# Patient Record
Sex: Female | Born: 1985 | Race: White | Hispanic: No | Marital: Single | Smoking: Never smoker
Health system: Southern US, Community
[De-identification: ages and names within clinical notes are randomized; demographics above are authoritative.]

## PROBLEM LIST (undated history)

## (undated) DIAGNOSIS — E041 Nontoxic single thyroid nodule: Secondary | ICD-10-CM

## (undated) DIAGNOSIS — N870 Mild cervical dysplasia: Secondary | ICD-10-CM

## (undated) DIAGNOSIS — E785 Hyperlipidemia, unspecified: Secondary | ICD-10-CM

## (undated) DIAGNOSIS — F329 Major depressive disorder, single episode, unspecified: Secondary | ICD-10-CM

## (undated) DIAGNOSIS — F32A Depression, unspecified: Secondary | ICD-10-CM

## (undated) DIAGNOSIS — F53 Postpartum depression: Secondary | ICD-10-CM

## (undated) DIAGNOSIS — E042 Nontoxic multinodular goiter: Secondary | ICD-10-CM

## (undated) DIAGNOSIS — N938 Other specified abnormal uterine and vaginal bleeding: Secondary | ICD-10-CM

## (undated) DIAGNOSIS — O99345 Other mental disorders complicating the puerperium: Secondary | ICD-10-CM

## (undated) HISTORY — DX: Other specified abnormal uterine and vaginal bleeding: N93.8

## (undated) HISTORY — DX: Major depressive disorder, single episode, unspecified: F32.9

## (undated) HISTORY — DX: Depression, unspecified: F32.A

## (undated) HISTORY — DX: Mild cervical dysplasia: N87.0

## (undated) HISTORY — DX: Nontoxic multinodular goiter: E04.2

## (undated) HISTORY — DX: Nontoxic single thyroid nodule: E04.1

## (undated) HISTORY — DX: Hyperlipidemia, unspecified: E78.5

---
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
## 2006-01-13 ENCOUNTER — Other Ambulatory Visit: Admission: RE | Admit: 2006-01-13 | Discharge: 2006-01-13 | Payer: Self-pay | Admitting: Family Medicine

## 2006-11-01 ENCOUNTER — Other Ambulatory Visit: Admission: RE | Admit: 2006-11-01 | Discharge: 2006-11-01 | Payer: Self-pay | Admitting: Family Medicine

## 2007-06-19 IMAGING — CT CT ABDOMEN W/ CM
1 of 3 series · 14 of 32 positions shown, 19 images · IV contrast (agent unspecified)
Comparison: None.

CLINICAL DATA: Fever and right flank pain.  Question renal abscess.
TECHNIQUE: Contiguous 5 mm axial images of the abdomen and pelvis were obtained after oral and 125 cc of nonionic intravenous contrast. 
 CT ABDOMEN WITH CONTRAST:

[Series 2: abd_pel 5.0 b40f st · axial · 0.63mm/px · z∈[-423,+37]mm · 14 of 104 slices shown, 19 images]
[im 6/104  soft-tissue]
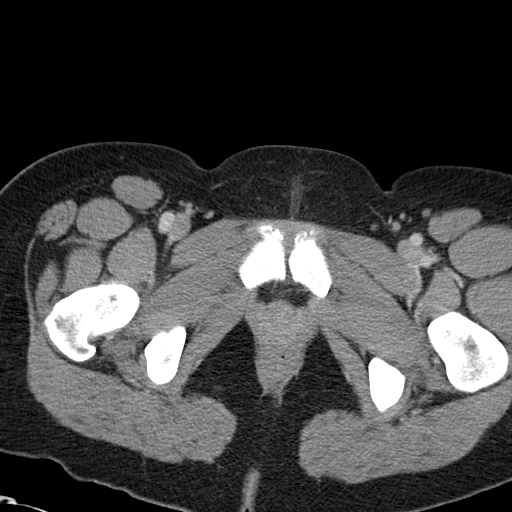
[im 6/104  bone]
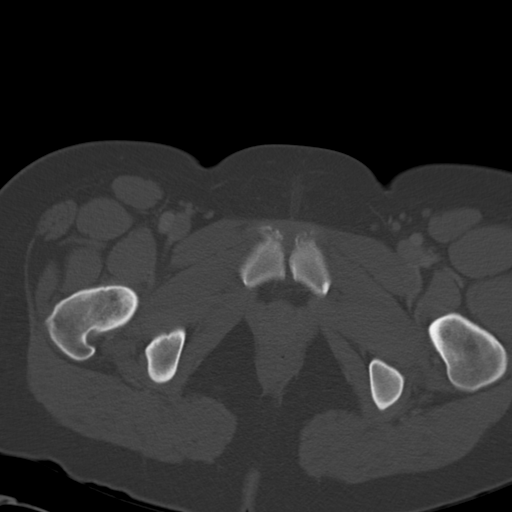
[im 16/104  soft-tissue]
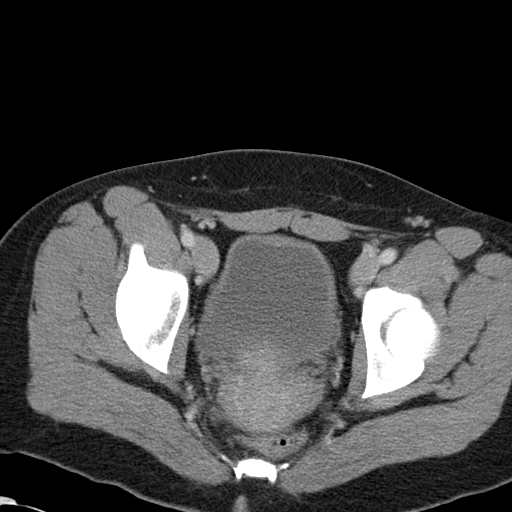
[im 21/104  soft-tissue]
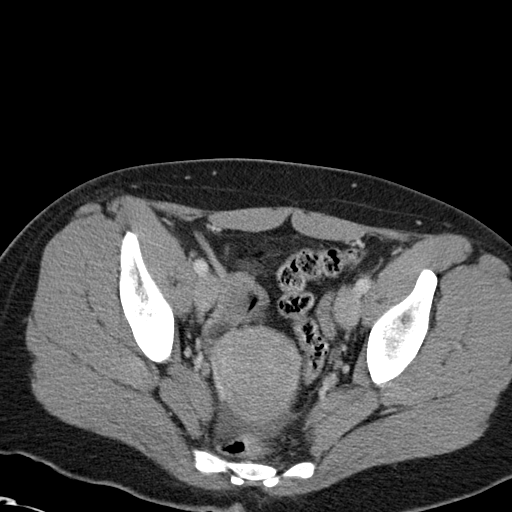
[im 31/104  soft-tissue]
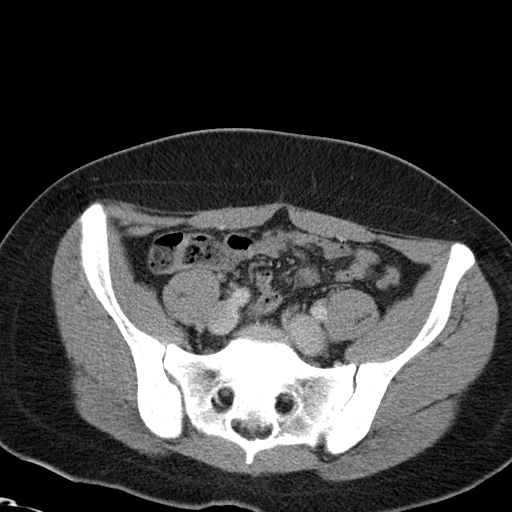
[im 37/104  soft-tissue]
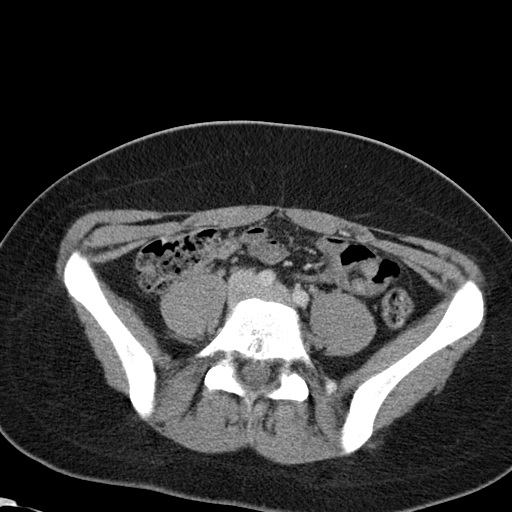
[im 47/104  soft-tissue]
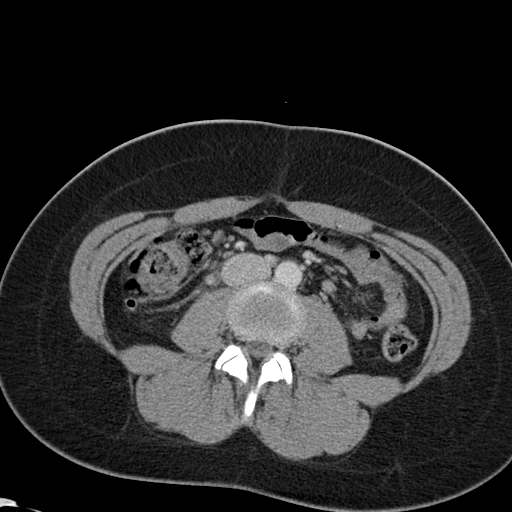
[im 52/104  soft-tissue]
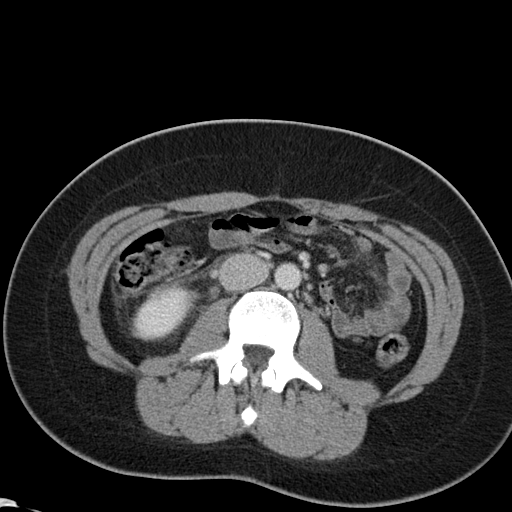
[im 57/104  soft-tissue]
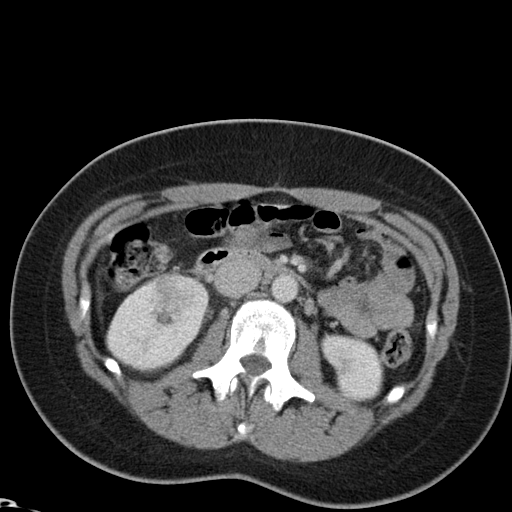
[im 67/104  soft-tissue]
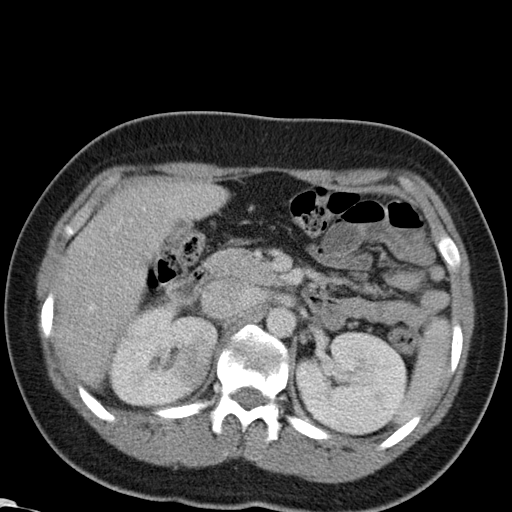
[im 67/104  bone]
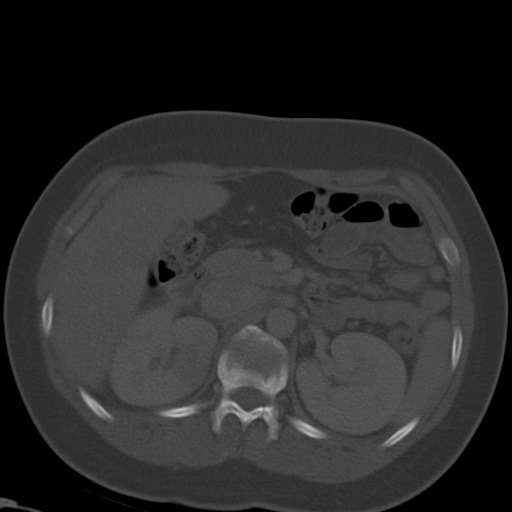
[im 73/104  soft-tissue]
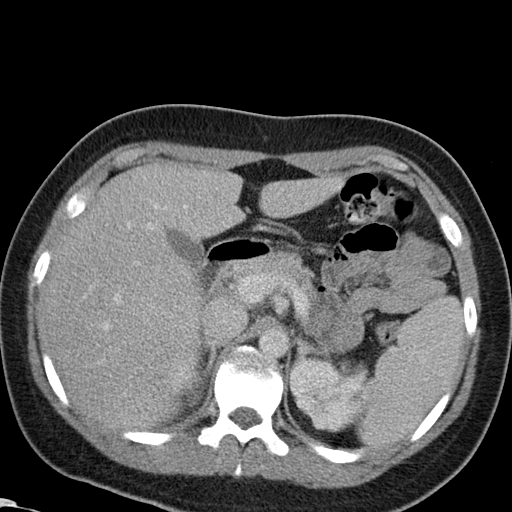
[im 83/104  soft-tissue]
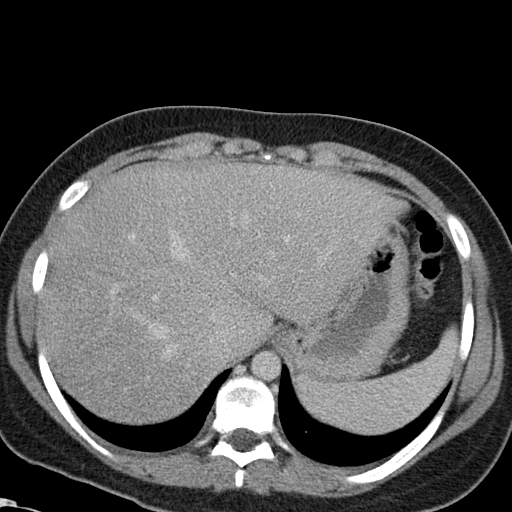
[im 83/104  lung]
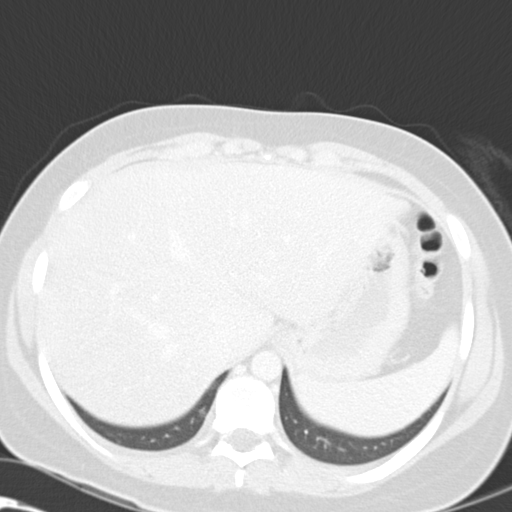
[im 88/104  soft-tissue]
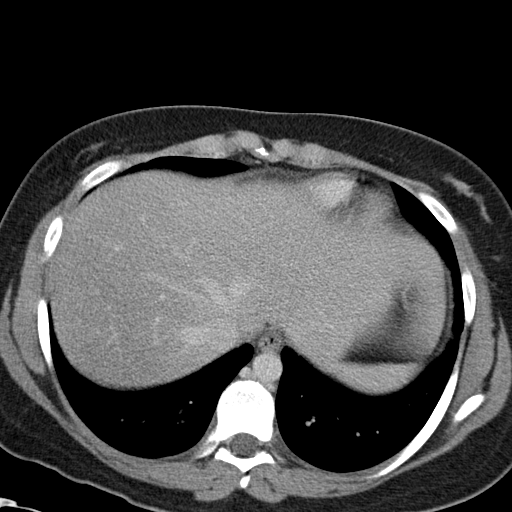
[im 88/104  lung]
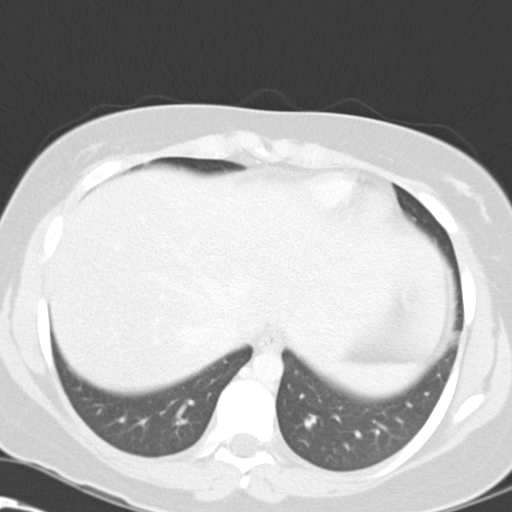
[im 93/104  lung]
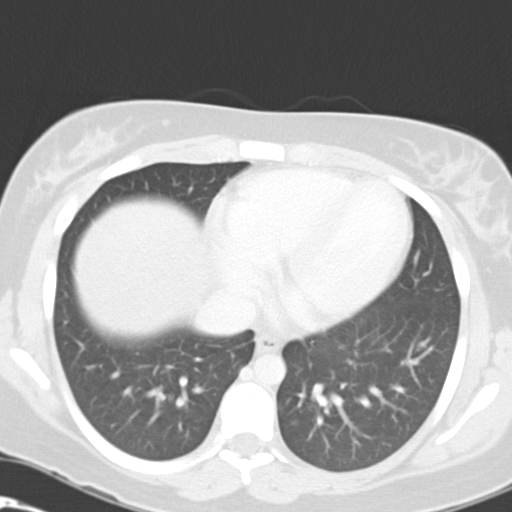
[im 98/104  soft-tissue]
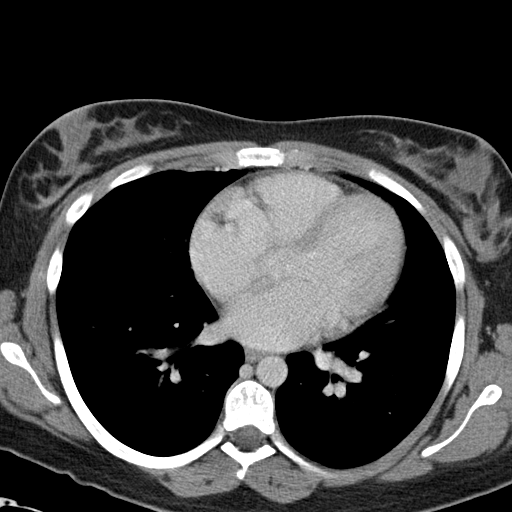
[im 98/104  lung]
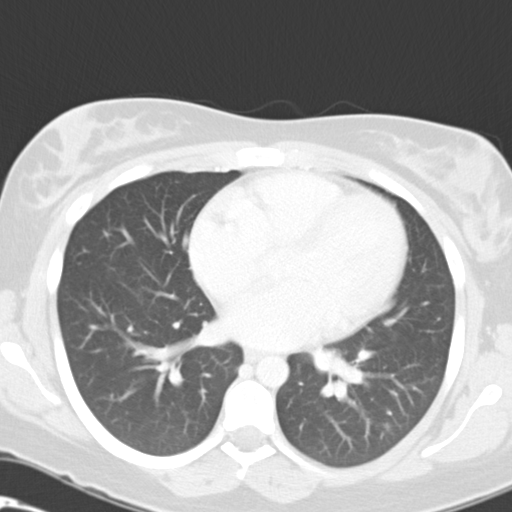

[14 of 32 positions shown; findings below may reference images not displayed]

FINDINGS: Incidental imaging of the lung bases demonstrates ill defined tiny nodular densities in the left lung base, likely related to atelectasis or prior inflammation. 
 The liver is fatty infiltrated without evidence for focal abnormality.  The spleen, stomach, duodenum, pancreas, gallbladder, adrenal glands, and abdominal bowel loops have normal features.  Three is no free fluid, lymphadenopathy, or abdominal aortic aneurysm. 
 The left kidney has a lobular upper pole contour which may be related to scarring, but I cannot completely exclude a mass at this location.  There are wedge shaped areas of edema at three different locations in the right kidney consistent with pyelonephritis.  No evidence for renal abscess at this time.  There is a mild amount of right perinephric edema.
IMPRESSION: Right sided pyelonephritis without evidence for renal abscess at this time.  
 Irregular contour at the upper pole of the left kidney is probably related to scarring although I cannot completely exclude a mass at this location.  Ultrasound may prove helpful or MRI with its multiplanar capability would likely provide additional insight. 
 CT PELVIS WITH CONTRAST:
FINDINGS: A small amount of free fluid is noted in the cul-de-sac.  No evidence for lymphadenopathy.  No adnexal mass.  The appendix and terminal ileum are normal.
IMPRESSION: No acute findings in the anatomic pelvis. 
 [REDACTED]

## 2007-06-23 HISTORY — PX: CHOLECYSTECTOMY: SHX55

## 2007-11-28 ENCOUNTER — Other Ambulatory Visit: Admission: RE | Admit: 2007-11-28 | Discharge: 2007-11-28 | Payer: Self-pay | Admitting: Family Medicine

## 2008-12-31 ENCOUNTER — Other Ambulatory Visit: Admission: RE | Admit: 2008-12-31 | Discharge: 2008-12-31 | Payer: Self-pay | Admitting: Family Medicine

## 2009-07-23 ENCOUNTER — Other Ambulatory Visit: Admission: RE | Admit: 2009-07-23 | Discharge: 2009-07-23 | Payer: Self-pay | Admitting: Family Medicine

## 2010-03-20 ENCOUNTER — Encounter: Admission: RE | Admit: 2010-03-20 | Discharge: 2010-03-20 | Payer: Self-pay | Admitting: Family Medicine

## 2010-11-07 NOTE — H&P (Signed)
NAMEAIMY, SWEETING                ACCOUNT NO.:  [id number]   MEDICAL RECORD NO.:  [id number]          PATIENT TYPE:  INP   LOCATION:  0103                         FACILITY:  [facility]   PHYSICIAN:  Theone Stanley, MD   DATE OF BIRTH:  [birth year]   DATE OF ADMISSION:  02/25/2005  DATE OF DISCHARGE:                                HISTORY & PHYSICAL   CHIEF COMPLAINT:  Fever, chills, myalgia.   HISTORY OF PRESENT ILLNESS:  [name] is a very pleasant 25 year old  female presenting with fever, weakness, malaise, headache, myalgias for the  past four days. It started on Saturday. The patient finally felt bad enough  that she went to the sit-in clinic. Because of her presentation, she was  subsequently told to go to the hospital. She underwent a lumbar puncture on  Tuesday which was negative. However, it was noted that she had a urinary  tract infection. She was given Levaquin in the hospital IV x1. She was  discharged, and because she had a fever of 104, she presented to the sit-in  clinic. They wanted to get a CT scan. The mother decided to bring her to  [facility] in the case that she might be admitted. The patient is still  complaining of mild headache and some fevers.   PAST MEDICAL HISTORY:  None.   MEDICATIONS:  Birth control pills.   ALLERGIES:  PENICILLIN. The patient got a stomachache. This was when she was  a child.   PAST SURGICAL HISTORY:  Tonsillectomy.   FAMILY HISTORY:  Positive for heart, hypertension, diabetes, cancer.   SOCIAL HISTORY:  The patient is from [place] but goes to [place] for  school. No tobacco, alcohol, or illicit drug use.   REVIEW OF SYSTEMS:  Nauseated. The patient had episodes of emesis two days  ago but currently none. She did have frequency and dysuria that improved.  She is also constipated. She has no appetite and has decreased oral intake.   PHYSICAL EXAMINATION:  VITAL SIGNS:  Temperature maximum at 100.7, blood  pressure 120/75,  pulse of 97, respirations 18. Saturating 99%.  HEENT:  Head is normocephalic and atraumatic. Eyes are 3 mm. Pupils react to  light. Extraocular movements intact. Ears normal. Nose and throat clear.  Mucosal moist.  NECK:  Supple. No lymphadenopathy. No JVD.  HEART:  Regular rate and rhythm. No murmurs, gallops, or rubs appreciated.  LUNGS:  Clear to auscultation bilaterally.  ABDOMEN:  Soft, nontender, and nondistended.  EXTREMITIES:  No clubbing, cyanosis, or edema.   LABORATORY DATA:  A CT scan of the abdomen was performed which showed  multiple areas of enhancement in the right kidney; however, no evidence of  abscess. Also noted some density-like areas on the left. Suggest a follow-up  MRI. White count of 15, hemoglobin 11, hematocrit 33, platelets of 153,000.  Sodium 136, potassium 3.3, chloride 108, bicarbonate 22, BUN 4, creatinine  0.8, calcium of 8.5. Urine was negative for nitrite, leukocyte esterase,  WBC's at 3 to 6. Pregnancy was negative.   ASSESSMENT:  [name] presents with fevers, chills,  myalgias, and  evidence of pyelonephritis on CT scan.   PLAN:  1.  Pyelonephritis:  The patient will be admitted for IV antibiotics, IV      hydration. We will need to follow up with the cultures that was obtained      in [facility]. Also blood cultures and urine cultures were obtained      here in the ER. Continue observation.  2.  Hypokalemia:  The patient had episodes of emesis previously. We will      replace this and continue to observe that.      Theone Stanley, MD  Electronically Signed     AEJ/MEDQ  D:  02/25/2005  T:  02/26/2005  Job:  [id number]   cc:   Stacie Acres. Cliffton Asters, M.D.  Fax: [phone]

## 2010-11-07 NOTE — Discharge Summary (Signed)
[name], [name]                ACCOUNT NO.:  [id number]   MEDICAL RECORD NO.:  [id number]          PATIENT TYPE:  OBV   LOCATION:  1412                         FACILITY:  [facility]   PHYSICIAN:  Deirdre Peer. Polite, M.D. DATE OF BIRTH:  02-20-1986   DATE OF ADMISSION:  02/25/2005  DATE OF DISCHARGE:                                 DISCHARGE SUMMARY   DISCHARGE DIAGNOSES:  1.  Pyelonephritis.  2.  Irregular contour at the upper pole of the left kidney probably related      to scar from prior pyelonephritis, ultrasound recommended.  Follow-up      ultrasound has been done with the reports pending.   DISCHARGE MEDICATION:  Cipro 500 mg b.i.d. x eight more days.   DISPOSITION:  Patient is discharged to home.   HISTORY OF PRESENT ILLNESS:  This is an 25 year old female who presented to  [facility] for evaluation of a fever, abdominal pain. Patient was  evaluated in ED.  CT was consistent with pyelonephritis.  Admission was  deemed necessary for further evaluation and treatment.  Please see dictated  H & P for further details.   PAST MEDICAL HISTORY:  None.   ADMISSION MEDICATIONS:  Birth control pills.   ALLERGIES:  Penicillin.   PAST SURGICAL HISTORY:  Tonsillectomy.   FAMILY HISTORY:  Significant for hypertension, diabetes mellitus.   SOCIAL HISTORY:  Negative for tobacco, alcohol or drugs.   REVIEW OF SYSTEMS:  Per admission H & P.   HOSPITAL COURSE:  Patient was admitted to a medical floor for evaluation and  treatment of pyelonephritis.  Patient presented with fever, weakness and  malaise.  Of note, the patient had been evaluated at school for similar  symptoms and diagnosed with urinary tract infection.  There they recommended  hospitalization, however, the patient's family did not want her hospitalized  there.  Also while at school, the patient underwent an LP because of  concerns of possible meningitis.  She also complained of headache when she  had her fever.   At this time, those results are not available, however,  records have been requested by the patient's mother.  We did receive lab  results which showed gram-negative rods in her urine and according to the  patient's father, they showed that the LP results were within normal limits.  During this hospitalization, the patient required judicious IV fluids and IV  antibiotics.  Patient still had nausea, vomiting, abdominal discomfort the  second day of admission.  However, the following day, the patient's symptoms  resolved and she is now clinically stable for discharge.  Please note that  the patient's CT did show pyelo of the right kidney and there was a regular  contour at the upper pole of the left kidney probably related to scarring.  Ultrasound has been recommended.  Ultrasound has been ordered but the  results are pending at the time of this dictation.  There is most likely  secondary scarring to prior pyelonephritis as the patient had described  similar symptoms before of frequency and dysuria, however, not seeking  medical  attention just consuming more fluids.  The patient has been  recommended that when she experiences these symptoms of frequency and  dysuria to seek medical attention for potential antibiotic to prevent  recurrence of pyelonephritis and further scarring of her kidneys.  At this  time, patient is clinically in stable condition.      Deirdre Peer. Polite, M.D.  Electronically Signed     RDP/MEDQ  D:  02/27/2005  T:  02/27/2005  Job:  [id number]

## 2010-11-07 NOTE — Op Note (Signed)
[name], [name]                ACCOUNT NO.:  [id number]   MEDICAL RECORD NO.:  [id number]          PATIENT TYPE:  AMB   LOCATION:  [facility]                         FACILITY:  [facility]   PHYSICIAN:  Mark C. Vernie Ammons, M.D.  DATE OF BIRTH:  August 28, 1985   DATE OF PROCEDURE:  08/27/2005  DATE OF DISCHARGE:                                 OPERATIVE REPORT   PREOPERATIVE DIAGNOSIS:  Bilateral vesicoureteral reflux.   POSTOPERATIVE DIAGNOSIS:  Bilateral vesicoureteral reflux.   PROCEDURE:  Cystoscopy with HIT procedure using Deflux.   SURGEON:  Mark C. Vernie Ammons, M.D.   ANESTHESIA:  General.   BLOOD LOSS:  None.   DRAINS:  None.   SPECIMENS:  None.   COMPLICATIONS:  None.   INDICATIONS:  The patient is an 25 year old white female whose had  difficulty with recurrent UTIs.  She has also had pyelonephritis and was  found on ultrasound to have right renal atrophy.  She was cleared of  infection and was found to have vesicoureteral reflux.  She is sexually  active and he is of childbearing age.  We discussed the treatment options.  She understands and has elected to proceed with endoscopic treatment.   DESCRIPTION OF OPERATION:  After informed consent, the patient went to the  major OR and was placed on the table, administered general anesthesia and  then moved to the dorsal lithotomy position. Her genitalia was sterilely  prepped and draped, and I used to 9-French pediatric cystoscope with the  offset lens and inspected the bladder.  It was noted to be free of any  tumor, stones or inflammatory lesions. Ureteral orifices were noted to be  laterally placed.  The right orifice appeared much more patulous with  hydrodistention compared to the left although both were wide open.   The Deflux needle was then passed through that port of the cystoscope and  hydrodistention of the right orifice was performed.  I advanced the needle  through the mucosa at the 6 o'clock position along the course of  the ureter  up to the mark on the needle with the bevel pointed at the 12 o'clock  position.  I then injected two syringes of Deflux material with excellent  effacement of the ureter.  I then performed an identical procedure on the  left-hand side.  There was good effacement and both sides hydrodistention  did not reveal opening of the ureteral orifice.  The bladder was drained.  The patient was awakened and taken to recovery in stable satisfactory  condition. Of note, I used three syringes of Deflux material on the left-  hand side.   She will be observed in the recovery room and receive 200 mg of Pyridium.  She will then be discharged home with followup in my office in three months  for [facility].      Mark C. Vernie Ammons, M.D.  Electronically Signed     MCO/MEDQ  D:  08/27/2005  T:  08/28/2005  Job:  [id number]

## 2010-11-07 NOTE — H&P (Signed)
[name], [name]                ACCOUNT NO.:  [id number]   MEDICAL RECORD NO.:  [id number]          PATIENT TYPE:  AMB   LOCATION:  [facility]                         [facility]ACILITY:  [facility]   PHYSICIAN:  James A. Ashley Royalty, M.D.DATE OF BIRTH:  10/24/1985   DATE OF ADMISSION:  08/27/2005  DATE OF DISCHARGE:                                HISTORY & PHYSICAL   HISTORY OF PRESENT ILLNESS:  This is a 25 year old gravida 2, para 2, who  underwent vaginal hysterectomy August 18, 2005. The patient's  postoperative course was complicated by an anemia which was felt to be  probably secondary to a pelvic hematoma. She was given 2 units of packed red  blood cells and responded beautifully to that. The hemoglobin appeared to be  quite stable and she remained afebrile throughout. She was discharged on the  second postpartum day with close follow up. She returned August 25, 2005 for  postoperative evaluation. She was afebrile and without complaints. CBC was  obtained that day and revealed a white blood count of 9700, hemoglobin 12.1.  The patient returned today stating that she has developed a low-grade fever  intermittently. The maximum she has recorded is 101.5 degrees Fahrenheit.  She also complains of some vague lower abdominal cramping. She also has some  cramping when she urinates though she denies dysuria per se. She also denies  urinary frequency or urgency, sputum production and calf tenderness.   MEDICATIONS:  Motrin 600 mg q.4 hours p.r.n., Zoloft 100 mg daily.   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   PAST SURGICAL HISTORY:  Cold knife conization April 29, 2005. Vaginal  hysterectomy as above.   ALLERGIES:  ASPIRIN.   FAMILY HISTORY:  Noncontributory.   SOCIAL HISTORY:  The patient denies use of  tobacco or significant alcohol.   REVIEW OF SYSTEMS:  Noncontributory.   PHYSICAL EXAMINATION:  GENERAL:  Well-developed, well-nourished, pleasant  white female in no acute distress.  VITAL SIGNS:  Temperature 100.3 degrees fetal heart Fahrenheit. Vital signs  stable.  SKIN:  Warm and dry without lesions.  CHEST:  Lungs are clear.  CARDIAC:  Regular rate and rhythm.  ABDOMEN:  Soft and nontender without masses or organomegaly. Bowel sounds  are active.  MUSCULOSKELETAL:  Examination reveals slight left CVA tenderness.  PELVIC EXAMINATION:  External genitalia slightly blood stained. There are no  lesions. The vagina is similarly without gross lesions. There is a small  amount of blood in the vagina. The vaginal cuff appears to be healing well.  It also appears to be intact. Bimanual examination is performed gingerly and  reveals no obvious mass or undue tenderness.   IMPRESSION:  1.  Status post total vaginal hysterectomy August 18, 2005.  2.  Probable pelvic hematoma.  3.  Anemia - probably secondary to #2, improved.  4.  Low-grade fever. Differential includes infected hematoma, urinary tract  infection. Doubt thromboembolic phenomenon, pulmonary origin, etc.   PLAN:  Will place patient on outpatient observation. Will obtain CT scan of  the abdomen and pelvis as well as blood cultures, catheterized urinalysis  and culture as well as CBC. The patient will be given analgesics without  antipyretic properties and observe closely.      James A. Ashley Royalty, M.D.  Electronically Signed     JAM/MEDQ  D:  08/27/2005  T:  08/27/2005  Job:  [id number]

## 2013-08-20 HISTORY — PX: TUBAL LIGATION: SHX77

## 2014-07-27 ENCOUNTER — Emergency Department (HOSPITAL_COMMUNITY): Admission: EM | Admit: 2014-07-27 | Discharge: 2014-07-27 | Discharge status: Absent without leave | Payer: Self-pay

## 2014-07-27 ENCOUNTER — Encounter (HOSPITAL_COMMUNITY): Payer: Self-pay | Admitting: *Deleted

## 2014-07-27 ENCOUNTER — Emergency Department (HOSPITAL_COMMUNITY)
Admission: EM | Admit: 2014-07-27 | Discharge: 2014-07-27 | Disposition: A | Discharge status: Home / Self Care | Attending: Emergency Medicine | Admitting: Emergency Medicine

## 2014-07-27 DIAGNOSIS — F418 Other specified anxiety disorders: Secondary | ICD-10-CM | POA: Diagnosis not present

## 2014-07-27 DIAGNOSIS — F419 Anxiety disorder, unspecified: Secondary | ICD-10-CM

## 2014-07-27 DIAGNOSIS — F329 Major depressive disorder, single episode, unspecified: Secondary | ICD-10-CM

## 2014-07-27 DIAGNOSIS — Z91040 Latex allergy status: Secondary | ICD-10-CM | POA: Diagnosis not present

## 2014-07-27 DIAGNOSIS — Z880 Allergy status to penicillin: Secondary | ICD-10-CM | POA: Insufficient documentation

## 2014-07-27 HISTORY — DX: Postpartum depression: F53.0

## 2014-07-27 HISTORY — DX: Other mental disorders complicating the puerperium: O99.345

## 2014-07-27 MED ORDER — SERTRALINE HCL 50 MG PO TABS: 50.0000 mg | ORAL_TABLET | Freq: Every day | ORAL | Status: DC

## 2014-07-27 NOTE — Discharge Instructions (Signed)
Read the information below.  Use the prescribed medication as directed.  Please discuss all new medications with your pharmacist.  You may return to the Emergency Department at any time for worsening condition or any new symptoms that concern you.  If there is any possibility that you might be pregnant, please let your health care provider know and discuss this with the pharmacist to ensure medication safety.  If you begin to feel more depressed, feel that you are unsafe or may cause harm to yourself or others, please call 911 or go to your nearest emergency department.    Depression Depression refers to feeling sad, low, down in the dumps, blue, gloomy, or empty. In general, there are two kinds of depression:  Normal sadness or normal grief. This kind of depression is one that we all feel from time to time after upsetting life experiences, such as the loss of a job or the ending of a relationship. This kind of depression is considered normal, is short lived, and resolves within a few days to 2 weeks. Depression experienced after the loss of a loved one (bereavement) often lasts longer than 2 weeks but normally gets better with time.  Clinical depression. This kind of depression lasts longer than normal sadness or normal grief or interferes with your ability to function at home, at work, and in school. It also interferes with your personal relationships. It affects almost every aspect of your life. Clinical depression is an illness. Symptoms of depression can also be caused by conditions other than those mentioned above, such as:  Physical illness. Some physical illnesses, including underactive thyroid gland (hypothyroidism), severe anemia, specific types of cancer, diabetes, uncontrolled seizures, heart and lung problems, strokes, and chronic pain are commonly associated with symptoms of depression.  Side effects of some prescription medicine. In some people, certain types of medicine can cause symptoms  of depression.  Substance abuse. Abuse of alcohol and illicit drugs can cause symptoms of depression. SYMPTOMS Symptoms of normal sadness and normal grief include the following:  Feeling sad or crying for short periods of time.  Not caring about anything (apathy).  Difficulty sleeping or sleeping too much.  No longer able to enjoy the things you used to enjoy.  Desire to be by oneself all the time (social isolation).  Lack of energy or motivation.  Difficulty concentrating or remembering.  Change in appetite or weight.  Restlessness or agitation. Symptoms of clinical depression include the same symptoms of normal sadness or normal grief and also the following symptoms:  Feeling sad or crying all the time.  Feelings of guilt or worthlessness.  Feelings of hopelessness or helplessness.  Thoughts of suicide or the desire to harm yourself (suicidal ideation).  Loss of touch with reality (psychotic symptoms). Seeing or hearing things that are not real (hallucinations) or having false beliefs about your life or the people around you (delusions and paranoia). DIAGNOSIS  The diagnosis of clinical depression is usually based on how bad the symptoms are and how long they have lasted. Your health care provider will also ask you questions about your medical history and substance use to find out if physical illness, use of prescription medicine, or substance abuse is causing your depression. Your health care provider may also order blood tests. TREATMENT  Often, normal sadness and normal grief do not require treatment. However, sometimes antidepressant medicine is given for bereavement to ease the depressive symptoms until they resolve. The treatment for clinical depression depends on how bad  the symptoms are but often includes antidepressant medicine, counseling with a mental health professional, or both. Your health care provider will help to determine what treatment is best for  you. Depression caused by physical illness usually goes away with appropriate medical treatment of the illness. If prescription medicine is causing depression, talk with your health care provider about stopping the medicine, decreasing the dose, or changing to another medicine. Depression caused by the abuse of alcohol or illicit drugs goes away when you stop using these substances. Some adults need professional help in order to stop drinking or using drugs. SEEK IMMEDIATE MEDICAL CARE IF:  You have thoughts about hurting yourself or others.  You lose touch with reality (have psychotic symptoms).  You are taking medicine for depression and have a serious side effect. FOR MORE INFORMATION  National Alliance on Mental Illness: www.nami.[employer]  National Institute of Mental Health: http://www.maynard.net/www.nimh.nih.gov Document Released: 06/05/2000 Document Revised: 10/23/2013 Document Reviewed: 09/07/2011 [facility] Patient Information 2015 [place], [place]. This information is not intended to replace advice given to you by your health care provider. Make sure you discuss any questions you have with your health care provider.  Panic Attacks Panic attacks are sudden, short-livedsurges of severe anxiety, fear, or discomfort. They may occur for no reason when you are relaxed, when you are anxious, or when you are sleeping. Panic attacks may occur for a number of reasons:   Healthy people occasionally have panic attacks in extreme, life-threatening situations, such as war or natural disasters. Normal anxiety is a protective mechanism of the body that helps us react to danger (fight or flight response).  Panic attacks are often seen with anxiety disorders, such as panic disorder, social anxiety disorder, generalized anxiety disorder, and phobias. Anxiety disorders cause excessive or uncontrollable anxiety. They may interfere with your relationships or other life activities.  Panic attacks are sometimes seen with other  mental illnesses, such as depression and posttraumatic stress disorder.  Certain medical conditions, prescription medicines, and drugs of abuse can cause panic attacks. SYMPTOMS  Panic attacks start suddenly, peak within 20 minutes, and are accompanied by four or more of the following symptoms:  Pounding heart or fast heart rate (palpitations).  Sweating.  Trembling or shaking.  Shortness of breath or feeling smothered.  Feeling choked.  Chest pain or discomfort.  Nausea or strange feeling in your stomach.  Dizziness, light-headedness, or feeling like you will faint.  Chills or hot flushes.  Numbness or tingling in your lips or hands and feet.  Feeling that things are not real or feeling that you are not yourself.  Fear of losing control or going crazy.  Fear of dying. Some of these symptoms can mimic serious medical conditions. For example, you may think you are having a heart attack. Although panic attacks can be very scary, they are not life threatening. DIAGNOSIS  Panic attacks are diagnosed through an assessment by your health care provider. Your health care provider will ask questions about your symptoms, such as where and when they occurred. Your health care provider will also ask about your medical history and use of alcohol and drugs, including prescription medicines. Your health care provider may order blood tests or other studies to rule out a serious medical condition. Your health care provider may refer you to a mental health professional for further evaluation. TREATMENT   Most healthy people who have one or two panic attacks in an extreme, life-threatening situation will not require treatment.  The treatment for panic attacks  associated with anxiety disorders or other mental illness typically involves counseling with a mental health professional, medicine, or a combination of both. Your health care provider will help determine what treatment is best for  you.  Panic attacks due to physical illness usually go away with treatment of the illness. If prescription medicine is causing panic attacks, talk with your health care provider about stopping the medicine, decreasing the dose, or substituting another medicine.  Panic attacks due to alcohol or drug abuse go away with abstinence. Some adults need professional help in order to stop drinking or using drugs. HOME CARE INSTRUCTIONS   Take all medicines as directed by your health care provider.   Schedule and attend follow-up visits as directed by your health care provider. It is important to keep all your appointments. SEEK MEDICAL CARE IF:  You are not able to take your medicines as prescribed.  Your symptoms do not improve or get worse. SEEK IMMEDIATE MEDICAL CARE IF:   You experience panic attack symptoms that are different than your usual symptoms.  You have serious thoughts about hurting yourself or others.  You are taking medicine for panic attacks and have a serious side effect. MAKE SURE YOU:  Understand these instructions.  Will watch your condition.  Will get help right away if you are not doing well or get worse. Document Released: 06/08/2005 Document Revised: 06/13/2013 Document Reviewed: 01/20/2013 [facility] Patient Information 2015 [place], [place]. This information is not intended to replace advice given to you by your health care provider. Make sure you discuss any questions you have with your health care provider.

## 2014-07-27 NOTE — ED Provider Notes (Signed)
CSN: [id number]     Arrival date & time 07/27/14  0840 History   First MD Initiated Contact with Patient 07/27/14 [id number]     Chief Complaint  Patient presents with  . Anxiety     (Consider location/radiation/quality/duration/timing/severity/associated sxs/prior Treatment) The history is provided by the patient.     Patient with hx depression presents with symptoms similar to what she has experienced in the past.  States she has had increased stress with work, her husband coming home from [employer], her mother-in-law moving in and she is feeling anxious, having difficulty getting to sleep and staying asleep, experiencing negative "self talk" and feels she is overreacting to even minor details.  States this feels like prior depression that has always been helped by taking Zoloft, 100mg  (with 50mg  daily x 1 week build up).  Denies any physical symptoms with exception of exhaustion from not sleeping.  Denies SI, HI.  Denies hearing voices that are not her own.  She has attempted to see her insurance-approved psychiatry group but they are closed on Fridays.  She has a new PCP Dr Adline PealsVivienne Sun whom she has not yet seen.    Past Medical History  Diagnosis Date  . Mild postpartum depression about 1 year ago    pt treated with Zoloft x2 months   Past Surgical History  Procedure Laterality Date  . Tubal ligation  march 2015  . Tonsillectomy  1993  . Cholecystectomy  2009   History reviewed. No pertinent family history. History  Substance Use Topics  . Smoking status: Never Smoker   . Smokeless tobacco: Not on file  . Alcohol Use: No   OB History    Obstetric Comments   2 children     Review of Systems  All other systems reviewed and are negative.     Allergies  Latex; Morphine and related; and Penicillins  Home Medications   Prior to Admission medications   Not on File   BP 125/71 mmHg  Pulse 69  Temp(Src) 98.1 F (36.7 C) (Oral)  Resp 18  Ht 5\' 7"  (1.702 m)  Wt 237 lb  (107.502 kg)  BMI 37.11 kg/m2  SpO2 98%  LMP 06/05/2014 (Approximate) Physical Exam  Constitutional: She appears well-developed and well-nourished. No distress.  HENT:  Head: Normocephalic and atraumatic.  Neck: Neck supple.  Cardiovascular: Normal rate and regular rhythm.   Pulmonary/Chest: Effort normal and breath sounds normal. No respiratory distress. She has no wheezes. She has no rales.  Abdominal: Soft. She exhibits no distension. There is no tenderness. There is no rebound and no guarding.  Neurological: She is alert.  Skin: She is not diaphoretic.  Psychiatric: Her speech is normal and behavior is normal. Thought content normal. Her mood appears anxious. Cognition and memory are normal.  Nursing note and vitals reviewed.   ED Course  Procedures (including critical care time) Labs Review Labs Reviewed - No data to display  Imaging Review No results found.   EKG Interpretation None      MDM   Final diagnoses:  Anxiety and depression    Afebrile, nontoxic patient with hx depression and anxiety experiencing symptoms consistent with same.  No SI, no red flags. Pt appears to be a reasonable, rational person with good follow up.  Has taken Zoloft several times in life previously and feels this will work for her again.  She declines any additional benzo.  She knows to seek help for worsening symptoms and specifically for any SI.  She will call and follow up with PCP and with psychiatry.    D/C home with two week supply of Zoloft.  Discussed result, findings, treatment, and follow up  with patient.  Pt given return precautions.  Pt verbalizes understanding and agrees with plan.          Trixie Dredge, PA-C 07/27/14 1618  Purvis Sheffield, MD 07/28/14 [phone]

## 2014-07-27 NOTE — ED Notes (Signed)
Pt reports she has been experiencing anxiety most recently after starting her new job on June 22, 2014.  Pt feels that she is overwhelmed with work as well as family demands.Pt has tried to contact psychiatrist within network but was not successful as they are closed on Fridays. Pt is scheduled to return to work this coming Monday and felt she needed to come in. Pt reports being on Zoloft in the past x 2 months after sons birth. She felt she was doing better so discontinued use.

## 2014-10-09 ENCOUNTER — Other Ambulatory Visit: Payer: Self-pay | Admitting: Orthopaedic Surgery

## 2014-10-09 DIAGNOSIS — M542 Cervicalgia: Secondary | ICD-10-CM

## 2014-10-10 ENCOUNTER — Ambulatory Visit
Admission: RE | Admit: 2014-10-10 | Discharge: 2014-10-10 | Disposition: A | Discharge status: Home / Self Care | Payer: BC Managed Care – PPO | Source: Ambulatory Visit | Attending: Orthopaedic Surgery | Admitting: Orthopaedic Surgery

## 2014-10-10 DIAGNOSIS — M542 Cervicalgia: Secondary | ICD-10-CM

## 2014-12-02 ENCOUNTER — Encounter (HOSPITAL_COMMUNITY): Payer: Self-pay

## 2014-12-02 ENCOUNTER — Emergency Department (HOSPITAL_COMMUNITY)
Admission: EM | Admit: 2014-12-02 | Discharge: 2014-12-02 | Disposition: A | Discharge status: Home / Self Care | Payer: BC Managed Care – PPO | Attending: Emergency Medicine | Admitting: Emergency Medicine

## 2014-12-02 DIAGNOSIS — S99921A Unspecified injury of right foot, initial encounter: Secondary | ICD-10-CM | POA: Diagnosis present

## 2014-12-02 DIAGNOSIS — Y92197 Garden or yard of other specified residential institution as the place of occurrence of the external cause: Secondary | ICD-10-CM | POA: Insufficient documentation

## 2014-12-02 DIAGNOSIS — Y939 Activity, unspecified: Secondary | ICD-10-CM | POA: Insufficient documentation

## 2014-12-02 DIAGNOSIS — F329 Major depressive disorder, single episode, unspecified: Secondary | ICD-10-CM | POA: Insufficient documentation

## 2014-12-02 DIAGNOSIS — Z79899 Other long term (current) drug therapy: Secondary | ICD-10-CM | POA: Insufficient documentation

## 2014-12-02 DIAGNOSIS — Y999 Unspecified external cause status: Secondary | ICD-10-CM | POA: Diagnosis not present

## 2014-12-02 DIAGNOSIS — Z91040 Latex allergy status: Secondary | ICD-10-CM | POA: Insufficient documentation

## 2014-12-02 DIAGNOSIS — S92211A Displaced fracture of cuboid bone of right foot, initial encounter for closed fracture: Secondary | ICD-10-CM | POA: Diagnosis not present

## 2014-12-02 DIAGNOSIS — X58XXXA Exposure to other specified factors, initial encounter: Secondary | ICD-10-CM | POA: Diagnosis not present

## 2014-12-02 DIAGNOSIS — Z880 Allergy status to penicillin: Secondary | ICD-10-CM | POA: Diagnosis not present

## 2014-12-02 MED ORDER — HYDROCODONE-ACETAMINOPHEN 5-325 MG PO TABS: 1.0000 | ORAL_TABLET | ORAL | Status: DC | PRN

## 2014-12-02 MED ORDER — ONDANSETRON HCL 4 MG PO TABS
4.0000 mg | ORAL_TABLET | Freq: Once | ORAL | Status: AC
  Administered 2014-12-02: 4 mg via ORAL
  Filled 2014-12-02: qty 1

## 2014-12-02 MED ORDER — HYDROCODONE-ACETAMINOPHEN 5-325 MG PO TABS
1.0000 | ORAL_TABLET | Freq: Once | ORAL | Status: AC
  Administered 2014-12-02: 1 via ORAL
  Filled 2014-12-02: qty 1

## 2014-12-02 NOTE — Discharge Instructions (Signed)
Tarsal Fracture  with Rehab The tarsal bones are a collection of 7 bones that collectively make up the back half of the foot (hind foot). A tarsal fracture is a break in one of these bones. The 7 tarsal bones are the ankle bone (talus), heel bone (calcaneus), the 4 cuneiform bones, the cuboid, and the navicular. SYMPTOMS   Severe pain at the time of injury, which may persist for several weeks.  Tenderness, inflammation, or bruising (contusion) at the fracture site.  Swelling within the foot, causing numbness and paralysis, which are signs of nerve and blood vessel damage (uncommon). CAUSES  Tarsal fractures occur when a force is placed on the bone that is greater than the bone can withstand. Common causes of injury include:  Direct trauma (injury from impact) to the foot.  Awkwardly landing on the foot after jumping.  Twisting injury to the ankle and foot. RISK INCREASES WITH:  Contact sports (football, rugby, or lacrosse).  Jumping sports (basketball or volleyball).  Previous ankle or foot injury.  Poor strength and flexibility. PREVENTION  Warm up and stretch properly before activity.  Maintain physical fitness:  Strength, flexibility, and endurance.  Cardiovascular fitness (increases heart rate).  Protect the ankle and foot with taping, braces, or compression bandages.  Wear properly fitted shoes that are appropriate for the activity. PROGNOSIS  If treated properly, tarsal fractures can be expected to heal with non-surgical treatment. Occasionally, surgery is necessary to treat bone fragments that are out of alignment (displaced fracture). RELATED COMPLICATIONS   Failure of the fracture to heal (nonunion).  Healing of the fracture in a poor position (malunion).  Recurring symptoms that result in a chronic problem.  Prolonged healing time, if improperly treated or re-injured.  Bone death, due to poor circulation to the fracture site (rare).  Arthritis of the  foot. TREATMENT  Treatment first involves the use of ice and medicine, to reduce pain and inflammation. If the bone fragments are out of alignment (displaced fracture), then the bone must be immediately realigned (reduction) by a trained professional. Fractures that cannot be realigned by hand, or fractures where the bones poke out through the skin (open fracture), may require surgery to hold the fracture in place with screws, pins, and plates. Once in proper alignment, the foot and ankle must be kept still for a period of time to allow for healing. After this period, it is important to perform strengthening and stretching exercises to help regain strength and a full range of motion. These exercises may be completed at home or with a therapist.  MEDICATION   If pain medicine is necessary, nonsteroidal anti-inflammatory medications (aspirin and ibuprofen) or other minor pain relievers (acetaminophen) are often recommended.  Do not take pain medication for 7 days before surgery.  Prescription pain relievers may be given if your caregiver thinks they are necessary. Use only as directed and only as much as you need. COLD THERAPY  Cold treatment (icing) relieves pain and reduces inflammation. Cold treatment should be applied for 10 to 15 minutes every 2 to 3 hours, and immediately after any activity that aggravates your symptoms. Use ice packs or an ice massage. SEEK MEDICAL CARE IF:   Treatment does not seem to help, or the condition worsens.  Any medicines produce negative side effects.  Any complications from surgery occur:  Pain, numbness, or coldness in the affected foot.  Discoloration beneath the toenails (blue or gray) of the affected foot.  Signs of infections (fever, pain, inflammation, redness,  or persistent bleeding). EXERCISES RANGE OF MOTION (ROM) AND STRETCHING EXERCISES - Tarsal Fracture These exercises may help you when beginning to restore activity to your injured foot. Your  symptoms may go away with or without further involvement from your physician, physical therapist or athletic trainer. While completing these exercises, remember:   Restoring tissue flexibility helps normal motion to return to the joints. This allows healthier, less painful movement and activity.  An effective stretch should be held for at least 30 seconds.  A stretch should never be painful. You should only feel a gentle lengthening or release in the stretched tissue. RANGE OF MOTION - Dorsi/Plantar Flexion  While sitting with your right / left knee straight, draw the top of your foot upwards, by flexing your ankle. Then reverse the motion, pointing your toes downward.  Hold each position for __________ seconds.  After completing your first set of exercises, repeat this exercise with your knee bent. Repeat __________ times. Complete this exercise __________ times per day.  RANGE OF MOTION- Ankle Plantar Flexion  Sit with your right / left leg crossed over your opposite knee.  Use your opposite hand to pull the top of your foot and toes toward you.  You should feel a gentle stretch on the top of your foot and ankle. Hold this position for __________ seconds. Repeat __________ times. Complete __________ times per day.  RANGE OF MOTION - Ankle Eversion  Sit with your right / left ankle crossed over your opposite knee.  Grip your foot with your opposite hand, placing your thumb on the top of your foot and your fingers across the bottom of your foot.  Gently push your foot downward with a slight rotation so your smallest toes rise slightly toward the ceiling.  You should feel a gentle stretch on the inside of your ankle. Hold the stretch for __________ seconds. Repeat __________ times. Complete this exercise __________ times per day.  RANGE OF MOTION - Ankle Inversion   Sit with your right / left ankle crossed over your opposite knee.  Grip your foot with your opposite hand, placing  your thumb on the bottom of your foot and your fingers across the top of your foot.  Gently pull your foot so the smallest toe comes toward you and your thumb pushes the ball of your foot away from you.  You should feel a gentle stretch on the outside of your ankle. Hold the stretch for __________seconds. Repeat __________ times. Complete this exercise __________ times per day.  RANGE OF MOTION - Ankle Alphabet  Imagine your right / left big toe is a pen.  Keeping your hip and knee still, write out the entire alphabet with your "pen." Make the letters as large as you can without any increasing discomfort. Repeat __________ times. Complete this exercise __________ times per day.  RANGE OF MOTION - Ankle Dorsiflexion, Active Assisted  Remove shoes and sit on a chair, preferably not on a carpeted surface.  Place right / left foot on floor, directly under knee. Extend your opposite leg for support.  Keeping your heel down, slide your right / left foot back toward the chair until you feel a stretch at your ankle or calf. If you do not feel a stretch, slide your bottom forward to the edge of the chair, while still keeping your heel down.  Hold this stretch for __________ seconds. Repeat __________ times. Complete this stretch __________ times per day.  STRETCH - Gastroc, Standing  Place hands on  wall.  Extend right / left leg behind you and place a folded washcloth under the arch of your foot for support. Keep the front knee somewhat bent.  Slightly point your toes inward on your back foot.  Keeping your right / left heel on the floor and your knee straight, shift your weight toward the wall, not allowing your back to arch.  You should feel a gentle stretch in the right / left calf. Hold this position for __________seconds. Repeat __________ times. Complete this stretch __________ times per day. STRETCH - Soleus, Standing  Place hands on wall.  Extend right / left leg behind you and  place a folded washcloth under the arch of your foot for support. Keep the front knee somewhat bent.  Slightly point your toes inward on your back foot.  Keep your right / left heel on the floor, bend your back knee, and slightly shift your weight over the back leg so that you feel a gentle stretch deep in your back calf.  Hold this position for __________ seconds. Repeat __________ times. Complete this stretch __________ times per day. STRENGTHENING EXERCISES - Tarsal Fracture These exercises may help you when beginning to restore activity to your injured foot. Your symptoms may go away with or without further involvement from your physician, physical therapist or athletic trainer. While completing these exercises, remember:   Muscles can gain both the endurance and the strength needed for everyday activities through controlled exercises.  Complete these exercises as instructed by your physician, physical therapist or athletic trainer. Increase the resistance and repetitions only as guided.  You may experience muscle soreness or fatigue, but the pain or discomfort you are trying to eliminate should never worsen during these exercises. If this pain does worsen, stop and make certain you are following the directions exactly. If the pain is still present after adjustments, discontinue the exercise until you can discuss the trouble with your caregiver. STRENGTH - Dorsiflexors  Secure a rubber exercise band or tubing to a fixed object (i.e. table, pole) and loop the other end around your right / left foot.  Sit on the floor facing the fixed object. The band should be slightly tense when your foot is relaxed.  Slowly draw your foot back toward you, using your ankle and toes.  Hold this position for __________ seconds. Slowly release the tension in the band and return your foot to the starting position. Repeat __________ times. Complete this exercise __________ times per day.  STRENGTH -  Plantar-flexors   Sit on the floor, with your right / left leg extended. Holding onto both ends of a rubber exercise band or tubing, loop it around the ball of your foot. Keep a slight tension in the band.  Slowly push your toes away from you, pointing them downward.  Hold this position for __________ seconds. Return to the starting position slowly, controlling the tension in the band. Repeat __________ times. Complete this exercise __________ times per day.  STRENGTH - Towel Curls  Sit in a chair, on a non-carpeted surface.  Place your foot on a towel, keeping your heel on the floor.  Pull the towel toward your heel only by curling your toes. Keep your heel on the floor.  If instructed by your physician, physical therapist or athletic trainer, add ____________________ at the end of the towel. Repeat __________ times. Complete this exercise __________ times per day. STRENGTH - Ankle Eversion  Secure one end of a rubber exercise band or tubing to  a fixed object (table, pole). Loop the other end around your foot, just before your toes.  Place your fists between your knees. This will focus your strengthening at your ankle.  Drawing the band across your opposite foot, away from the pole, slowly, pull your little toe out and up. Make sure the band is positioned to resist the entire motion.  Hold this position for __________ seconds.  Return to the starting position slowly, controlling the tension in the band. Repeat __________ times. Complete this exercise __________ times per day.  STRENGTH - Ankle Inversion  Secure one end of a rubber exercise band or tubing to a fixed object (i.e. table, pole). Loop the other end around your foot, just before your toes.  Place your fists between your knees. This will focus your strengthening at your ankle.  Slowly, pull your big toe up and in, making sure the band is positioned to resist the entire motion.  Hold this position for __________  seconds.  Return to the starting position slowly, controlling the tension in the band. Repeat __________ times. Complete this exercises __________ times per day.  STRENGTH - Plantar-flexors, Standing  Stand with your feet shoulder width apart. Place your hands on a wall or table to steady yourself, using as little support as needed.  Keeping your weight evenly spread over the width of your feet, rise up on your toes.*  Hold this position for __________ seconds. Repeat __________ times. Complete this exercise __________ times per day.  *If this is too easy, shift your weight toward your right / left leg until you feel challenged. Ultimately, you may be asked to do this exercise while standing on your right / left foot only. Document Released: 06/08/2005 Document Revised: 08/31/2011 Document Reviewed: 09/20/2008 [facility] Patient Information 2015 [place], [place]. This information is not intended to replace advice given to you by your health care provider. Make sure you discuss any questions you have with your health care provider.  Cast or Splint Care Casts and splints support injured limbs and keep bones from moving while they heal. It is important to care for your cast or splint at home.  HOME CARE INSTRUCTIONS  Keep the cast or splint uncovered during the drying period. It can take 24 to 48 hours to dry if it is made of plaster. A fiberglass cast will dry in less than 1 hour.  Do not rest the cast on anything harder than a pillow for the first 24 hours.  Do not put weight on your injured limb or apply pressure to the cast until your health care provider gives you permission.  Keep the cast or splint dry. Wet casts or splints can lose their shape and may not support the limb as well. A wet cast that has lost its shape can also create harmful pressure on your skin when it dries. Also, wet skin can become infected.  Cover the cast or splint with a plastic bag when bathing or when out in the  rain or snow. If the cast is on the trunk of the body, take sponge baths until the cast is removed.  If your cast does become wet, dry it with a towel or a blow dryer on the cool setting only.  Keep your cast or splint clean. Soiled casts may be wiped with a moistened cloth.  Do not place any hard or soft foreign objects under your cast or splint, such as cotton, toilet paper, lotion, or powder.  Do not try to  scratch the skin under the cast with any object. The object could get stuck inside the cast. Also, scratching could lead to an infection. If itching is a problem, use a blow dryer on a cool setting to relieve discomfort.  Do not trim or cut your cast or remove padding from inside of it.  Exercise all joints next to the injury that are not immobilized by the cast or splint. For example, if you have a long leg cast, exercise the hip joint and toes. If you have an arm cast or splint, exercise the shoulder, elbow, thumb, and fingers.  Elevate your injured arm or leg on 1 or 2 pillows for the first 1 to 3 days to decrease swelling and pain.It is best if you can comfortably elevate your cast so it is higher than your heart. SEEK MEDICAL CARE IF:   Your cast or splint cracks.  Your cast or splint is too tight or too loose.  You have unbearable itching inside the cast.  Your cast becomes wet or develops a soft spot or area.  You have a bad smell coming from inside your cast.  You get an object stuck under your cast.  Your skin around the cast becomes red or raw.  You have new pain or worsening pain after the cast has been applied. SEEK IMMEDIATE MEDICAL CARE IF:   You have fluid leaking through the cast.  You are unable to move your fingers or toes.  You have discolored (blue or white), cool, painful, or very swollen fingers or toes beyond the cast.  You have tingling or numbness around the injured area.  You have severe pain or pressure under the cast.  You have any  difficulty with your breathing or have shortness of breath.  You have chest pain. Document Released: 06/05/2000 Document Revised: 03/29/2013 Document Reviewed: 12/15/2012 [facility] Patient Information 2015 [place], [place]. This information is not intended to replace advice given to you by your health care provider. Make sure you discuss any questions you have with your health care provider.

## 2014-12-02 NOTE — ED Notes (Signed)
Patient was stepping in the garden, missing a retainer wall. Patient went to an UC yesterday and was told she had a fracture. Patient states the pain is not being controlled by ice or ibuprofen.

## 2014-12-02 NOTE — ED Provider Notes (Signed)
CSN: [id number]     Arrival date & time 12/02/14  1552 History  This chart was scribed for non-physician practitioner, Arthor Captain PA,  working with Eber Hong, MD, by Tanda Rockers, ED Scribe. This patient was seen in room WTR7/WTR7 and the patient's care was started at 4:18 PM.    Chief Complaint  Patient presents with  . Foot Pain   The history is provided by the patient. No language interpreter was used.     HPI Comments: [name] is a 29 y.o. female who presents to the Emergency Department complaining of right foot pain x 1 day. Pt missed a step in her garden yesterday and jammed her foot, causing the pain. Pt was seen at [facility] yesterday and was told she has a cuboid avulsion fracture. Pt was not given crutches in urgent care yesterday. She has been taking Ibuprofen and applying ice without relief. Denies weakness, numbness, or any other associated symptoms.   Past Medical History  Diagnosis Date  . Mild postpartum depression about 1 year ago    pt treated with Zoloft x2 months   Past Surgical History  Procedure Laterality Date  . Tubal ligation  march 2015  . Tonsillectomy  1993  . Cholecystectomy  2009   Family History  Problem Relation Age of Onset  . Hypertension Mother    History  Substance Use Topics  . Smoking status: Never Smoker   . Smokeless tobacco: Not on file  . Alcohol Use: No   OB History    Obstetric Comments   2 children     Review of Systems  Constitutional: Negative for fever and chills.  HENT: Negative for congestion and rhinorrhea.   Respiratory: Negative for cough and shortness of breath.   Cardiovascular: Negative for chest pain.  Gastrointestinal: Negative for nausea and vomiting.  Musculoskeletal: Positive for arthralgias (RIght foot pain. ) and gait problem.  Skin: Negative for color change.  Neurological: Negative for weakness and numbness.      Allergies  Latex; Morphine and related; and Penicillins  Home Medications    Prior to Admission medications   Medication Sig Start Date End Date Taking? Authorizing Provider  sertraline (ZOLOFT) 50 MG tablet Take 1 tablet (50 mg total) by mouth daily. X 1 week, then 2 PO daily 07/27/14   Trixie Dredge, PA-C   Triage Vitals: BP 128/86 mmHg  Pulse 95  Temp(Src) 97.8 F (36.6 C) (Oral)  Resp 18  Ht 5\' 7"  (1.702 m)  Wt 250 lb (113.399 kg)  BMI 39.15 kg/m2  SpO2 96%  LMP 11/20/2014   Physical Exam  Constitutional: She is oriented to person, place, and time. She appears well-developed and well-nourished. No distress.  HENT:  Head: Normocephalic and atraumatic.  Eyes: Conjunctivae and EOM are normal.  Neck: Neck supple. No tracheal deviation present.  Cardiovascular: Normal rate.   Pulmonary/Chest: Effort normal. No respiratory distress.  Musculoskeletal: Normal range of motion.  Sig swelling over the proximal aspect of the lateral dorsum of the right foot Notable swelling in the 5th toe with some bruising medially    Neurological: She is alert and oriented to person, place, and time.  Skin: Skin is warm and dry.  Psychiatric: She has a normal mood and affect. Her behavior is normal.  Nursing note and vitals reviewed.   ED Course  Procedures (including critical care time)  DIAGNOSTIC STUDIES: Oxygen Saturation is 96% on RA, normal by my interpretation.    COORDINATION OF CARE: 4:23  PM-Discussed treatment plan which includes crutches, cam walker, and pain medication with pt at bedside and pt agreed to plan. Will give pt referral to orthopedist as well.   Labs Review Labs Reviewed - No data to display  Imaging Review No results found.   EKG Interpretation None      MDM   Final diagnoses:  Cuboid fracture, right, closed, initial encounter   Patient with a cuboid fracture diagnosed at outpatient urgent care facility. Her pain is uncontrolled with a postop shoe, ice and ibuprofen. Patient has been ambulating on the fracture. Patient will be  discharged today with cam walker, crutches, increased pain medications, continue with ibuprofen, ice, elevate and orthophoric. She is neurovascularly intact and appears safe for discharge at this time  I personally performed the services described in this documentation, which was scribed in my presence. The recorded information has been reviewed and is accurate.        Arthor Captain, PA-C 12/02/14 1646  Eber Hong, MD 12/03/14 [phone]

## 2015-03-11 ENCOUNTER — Other Ambulatory Visit: Payer: Self-pay | Admitting: Physician Assistant

## 2015-03-11 DIAGNOSIS — E041 Nontoxic single thyroid nodule: Secondary | ICD-10-CM

## 2015-09-30 ENCOUNTER — Other Ambulatory Visit: Payer: Self-pay | Admitting: Physician Assistant

## 2015-09-30 DIAGNOSIS — Z87898 Personal history of other specified conditions: Secondary | ICD-10-CM

## 2015-10-07 ENCOUNTER — Other Ambulatory Visit: Payer: Self-pay | Admitting: Family Medicine

## 2015-10-07 DIAGNOSIS — R945 Abnormal results of liver function studies: Secondary | ICD-10-CM

## 2015-10-15 ENCOUNTER — Ambulatory Visit
Admission: RE | Admit: 2015-10-15 | Discharge: 2015-10-15 | Disposition: A | Discharge status: Home / Self Care | Payer: BC Managed Care – PPO | Source: Ambulatory Visit | Attending: Physician Assistant | Admitting: Physician Assistant

## 2015-10-15 ENCOUNTER — Ambulatory Visit
Admission: RE | Admit: 2015-10-15 | Discharge: 2015-10-15 | Disposition: A | Discharge status: Home / Self Care | Payer: BC Managed Care – PPO | Source: Ambulatory Visit | Attending: Family Medicine | Admitting: Family Medicine

## 2015-10-15 DIAGNOSIS — R945 Abnormal results of liver function studies: Secondary | ICD-10-CM

## 2015-10-15 DIAGNOSIS — E041 Nontoxic single thyroid nodule: Secondary | ICD-10-CM

## 2015-10-21 ENCOUNTER — Other Ambulatory Visit: Payer: Self-pay | Admitting: Physician Assistant

## 2015-10-21 DIAGNOSIS — E041 Nontoxic single thyroid nodule: Secondary | ICD-10-CM

## 2015-10-23 ENCOUNTER — Other Ambulatory Visit: Payer: Self-pay

## 2015-10-29 ENCOUNTER — Other Ambulatory Visit (HOSPITAL_COMMUNITY)
Admission: RE | Admit: 2015-10-29 | Discharge: 2015-10-29 | Disposition: A | Discharge status: Home / Self Care | Payer: BC Managed Care – PPO | Source: Ambulatory Visit | Attending: Radiology | Admitting: Radiology

## 2015-10-29 ENCOUNTER — Ambulatory Visit
Admission: RE | Admit: 2015-10-29 | Discharge: 2015-10-29 | Disposition: A | Discharge status: Home / Self Care | Payer: BC Managed Care – PPO | Source: Ambulatory Visit | Attending: Physician Assistant | Admitting: Physician Assistant

## 2015-10-29 DIAGNOSIS — E041 Nontoxic single thyroid nodule: Secondary | ICD-10-CM

## 2015-12-03 ENCOUNTER — Ambulatory Visit (INDEPENDENT_AMBULATORY_CARE_PROVIDER_SITE_OTHER): Payer: BC Managed Care – PPO | Admitting: Endocrinology

## 2015-12-03 ENCOUNTER — Encounter: Payer: Self-pay | Admitting: Endocrinology

## 2015-12-03 VITALS — BP 138/86 | HR 97

## 2015-12-03 DIAGNOSIS — F32A Depression, unspecified: Secondary | ICD-10-CM

## 2015-12-03 DIAGNOSIS — N870 Mild cervical dysplasia: Secondary | ICD-10-CM

## 2015-12-03 DIAGNOSIS — F329 Major depressive disorder, single episode, unspecified: Secondary | ICD-10-CM | POA: Diagnosis not present

## 2015-12-03 DIAGNOSIS — E041 Nontoxic single thyroid nodule: Secondary | ICD-10-CM

## 2015-12-03 DIAGNOSIS — E042 Nontoxic multinodular goiter: Secondary | ICD-10-CM

## 2015-12-03 DIAGNOSIS — E785 Hyperlipidemia, unspecified: Secondary | ICD-10-CM | POA: Diagnosis not present

## 2015-12-03 DIAGNOSIS — N938 Other specified abnormal uterine and vaginal bleeding: Secondary | ICD-10-CM | POA: Diagnosis not present

## 2015-12-03 NOTE — Patient Instructions (Signed)
No further thyroid testing or any treatment is needed now.  Please try taking prilosec daily for 2 weeks.  If the hoarseness does not improve, please call, and we wold be happy to refer you to an ENT specialist.   Please return in 1 year.
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
## 2015-12-04 ENCOUNTER — Encounter: Payer: Self-pay | Admitting: Endocrinology

## 2015-12-04 DIAGNOSIS — E041 Nontoxic single thyroid nodule: Secondary | ICD-10-CM | POA: Insufficient documentation

## 2015-12-04 DIAGNOSIS — N938 Other specified abnormal uterine and vaginal bleeding: Secondary | ICD-10-CM | POA: Insufficient documentation

## 2015-12-04 DIAGNOSIS — F329 Major depressive disorder, single episode, unspecified: Secondary | ICD-10-CM | POA: Insufficient documentation

## 2015-12-04 DIAGNOSIS — F32A Depression, unspecified: Secondary | ICD-10-CM | POA: Insufficient documentation

## 2015-12-04 DIAGNOSIS — E785 Hyperlipidemia, unspecified: Secondary | ICD-10-CM | POA: Insufficient documentation

## 2015-12-04 DIAGNOSIS — N870 Mild cervical dysplasia: Secondary | ICD-10-CM | POA: Insufficient documentation

## 2016-12-02 ENCOUNTER — Ambulatory Visit: Payer: BC Managed Care – PPO | Admitting: Endocrinology

## 2018-02-19 IMAGING — US US THYROID BIOPSY
1 series · 13 of 15 positions shown · non-contrast
Comparison: US Soft Tissue Head/Neck 10/15/2015

MEDICATIONS:
10 cc 1% lidocaine

COMPLICATIONS:
None immediate.

INDICATION: Indeterminate thyroid nodule

Right thyroid nodule
1.5 cm x 1.0 cm x 1.3 cm
EXAM:
ULTRASOUND GUIDED FINE NEEDLE ASPIRATION OF INDETERMINATE THYROID
NODULE
TECHNIQUE: Informed written consent was obtained from the patient after a
discussion of the risks, benefits and alternatives to treatment.
Questions regarding the procedure were encouraged and answered. A
timeout was performed prior to the initiation of the procedure.

[Series 1: us thyroid biopsy · 0.06mm/px · 15 acquisitions, 13 frames shown]
[im 1/15]
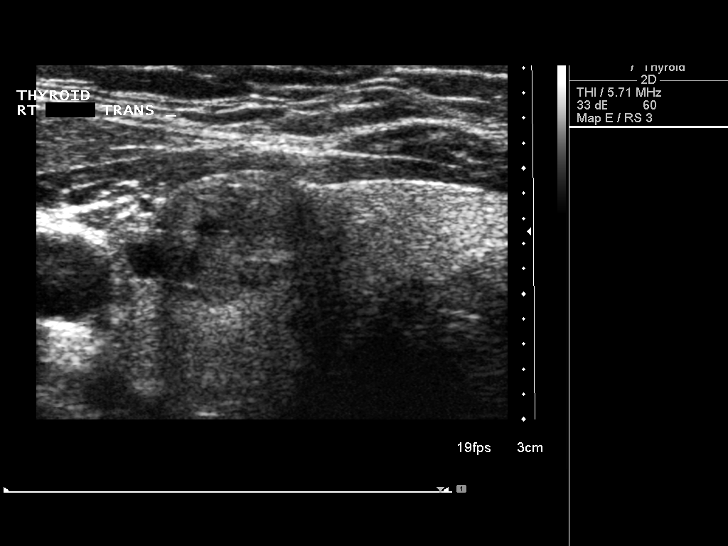
[im 2/15]
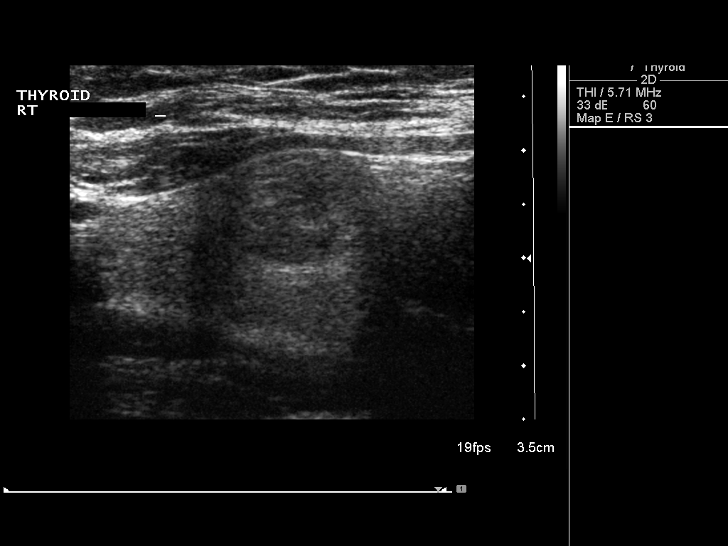
[im 3/15]
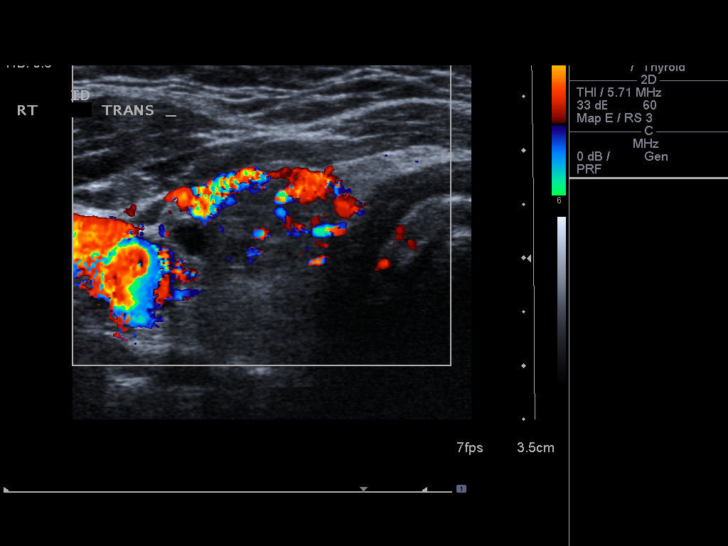
[im 5/15]
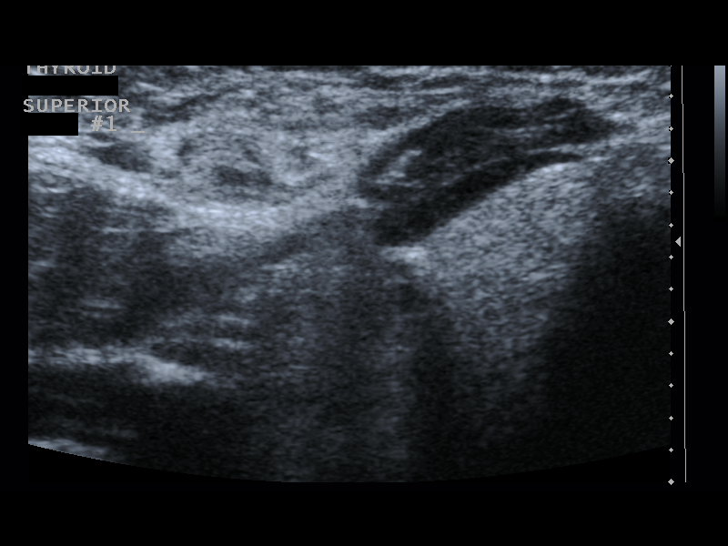
[im 6/15]
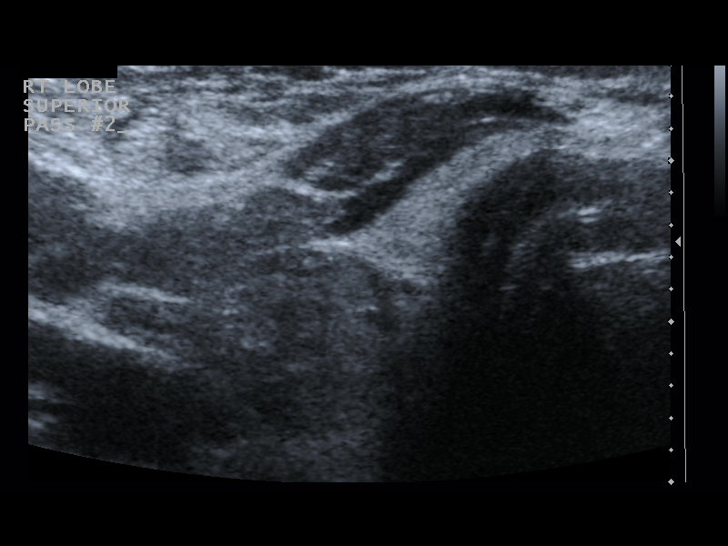
[im 7/15]
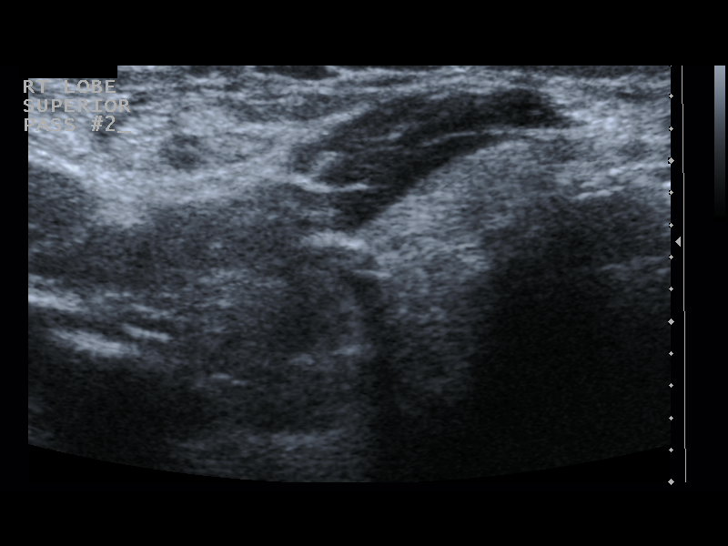
[im 8/15]
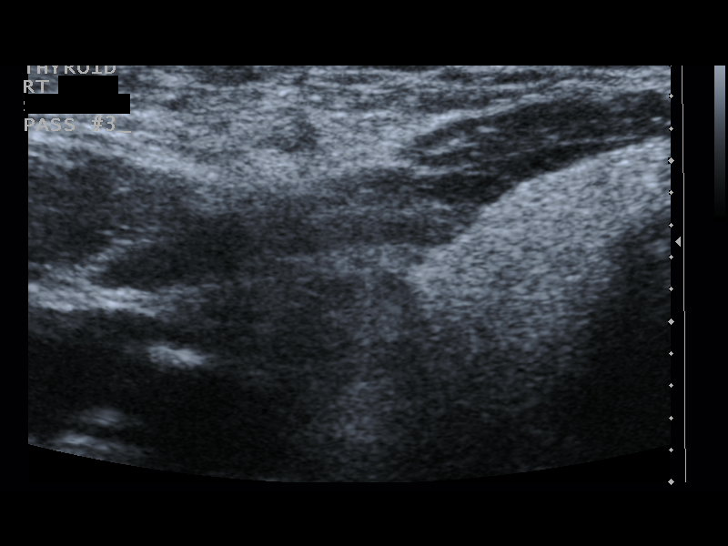
[im 9/15]
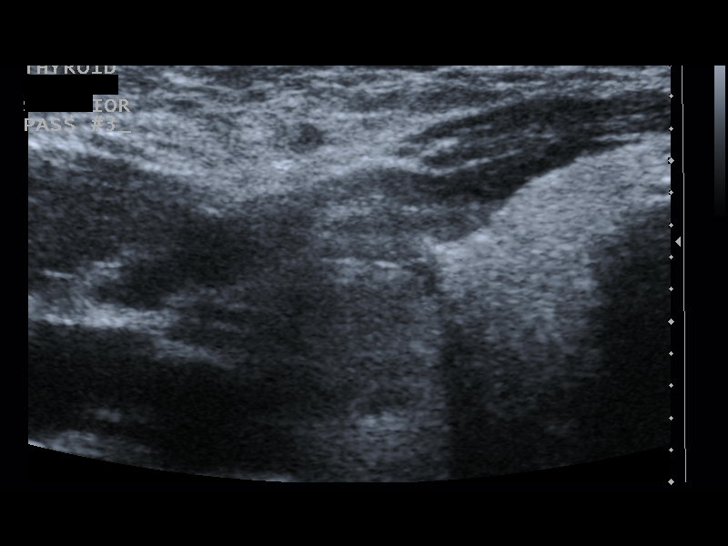
[im 10/15]
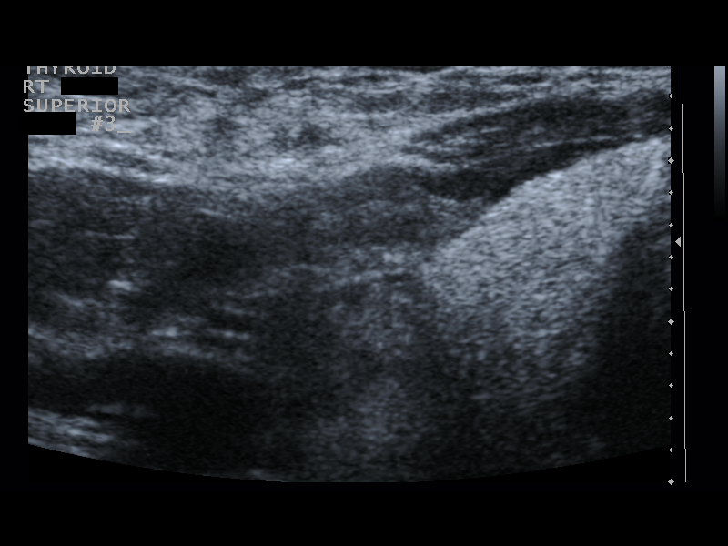
[im 11/15]
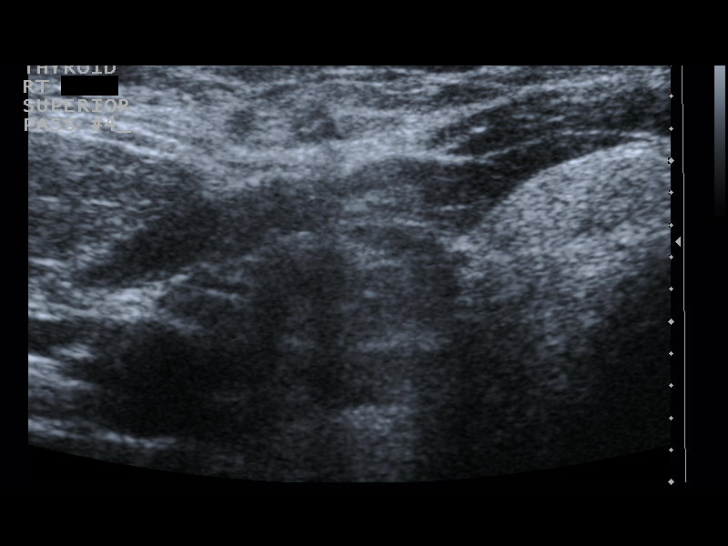
[im 13/15]
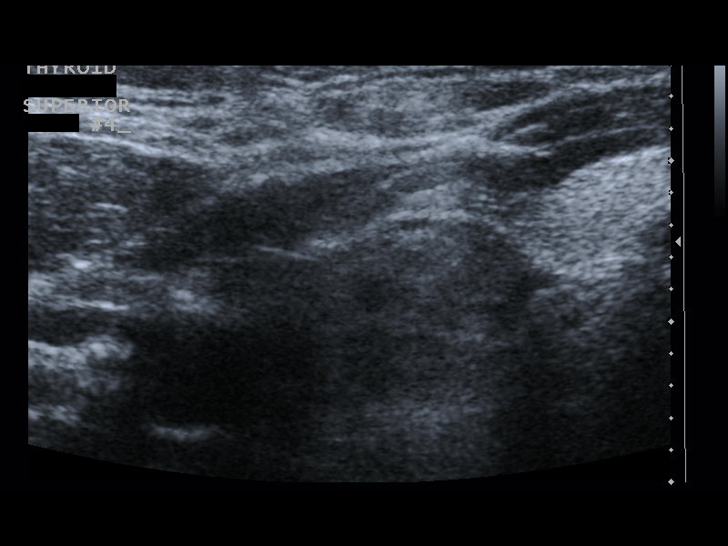
[im 14/15]
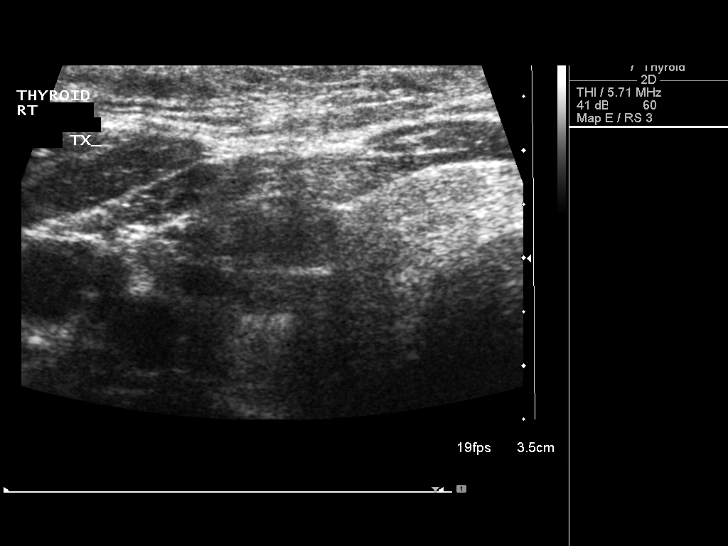
[im 15/15]
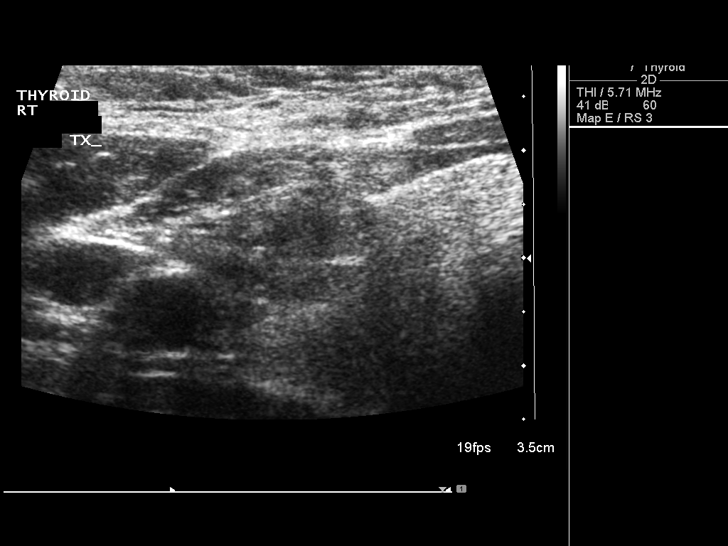

[13 of 15 positions shown; findings below may reference images not displayed]

Pre-procedural ultrasound scanning demonstrated right thyroid nodule

The procedure was planned. The neck was prepped in the usual sterile
fashion, and a sterile drape was applied covering the operative
field. A timeout was performed prior to the initiation of the
procedure. Local anesthesia was provided with 1% lidocaine.

Under direct ultrasound guidance, 4 FNA biopsies were performed of
the right thyroid nodule with a 25 gauge needle. The samples were
prepared and submitted to pathology.

Limited post procedural scanning was negative for hematoma or
additional complication. Dressings were placed. The patient
tolerated the above procedures procedure well without immediate
postprocedural complication.
IMPRESSION: Technically successful ultrasound guided fine needle aspiration of
right thyroid nodule

Read by:  Arie-Jan Noldus
